# Patient Record
Sex: Male | Born: 1975 | Race: White | Hispanic: No | Marital: Married | Smoking: Current every day smoker
Health system: Southern US, Community
[De-identification: ages and names within clinical notes are randomized; demographics above are authoritative.]

## PROBLEM LIST (undated history)

## (undated) DIAGNOSIS — J45909 Unspecified asthma, uncomplicated: Secondary | ICD-10-CM

## (undated) HISTORY — PX: CHOLECYSTECTOMY: SHX55

---

## 2019-03-14 ENCOUNTER — Other Ambulatory Visit: Payer: Self-pay

## 2019-03-14 ENCOUNTER — Emergency Department (HOSPITAL_COMMUNITY): Payer: Self-pay

## 2019-03-14 ENCOUNTER — Emergency Department (HOSPITAL_COMMUNITY)
Admission: EM | Admit: 2019-03-14 | Discharge: 2019-03-14 | Disposition: A | Discharge status: Home / Self Care | Payer: Self-pay | Attending: Emergency Medicine | Admitting: Emergency Medicine

## 2019-03-14 ENCOUNTER — Encounter (HOSPITAL_COMMUNITY): Payer: Self-pay

## 2019-03-14 DIAGNOSIS — M25562 Pain in left knee: Secondary | ICD-10-CM | POA: Insufficient documentation

## 2019-03-14 DIAGNOSIS — F17210 Nicotine dependence, cigarettes, uncomplicated: Secondary | ICD-10-CM | POA: Insufficient documentation

## 2019-03-14 DIAGNOSIS — J45909 Unspecified asthma, uncomplicated: Secondary | ICD-10-CM | POA: Insufficient documentation

## 2019-03-14 DIAGNOSIS — M25462 Effusion, left knee: Secondary | ICD-10-CM | POA: Insufficient documentation

## 2019-03-14 HISTORY — DX: Unspecified asthma, uncomplicated: J45.909

## 2019-03-14 MED ORDER — IBUPROFEN 400 MG PO TABS
600.0000 mg | ORAL_TABLET | Freq: Once | ORAL | Status: AC
  Administered 2019-03-14: 19:00:00 600 mg via ORAL
  Filled 2019-03-14: qty 2

## 2019-03-14 MED ORDER — IBUPROFEN 600 MG PO TABS: 600.0000 mg | ORAL_TABLET | Freq: Four times a day (QID) | ORAL | 0 refills | Status: DC | PRN

## 2019-03-14 NOTE — ED Notes (Addendum)
Patient requested that we contact his fiance to let her know that she can come back to pt's room to see him in ED. Attempted to contact fiance with no response.

## 2019-03-14 NOTE — Discharge Instructions (Signed)
Please use knee immobilizer and crutches, ice and elevate the knee as much as possible.  Take Motrin 600 mg every 6 hours, you can use Tylenol in addition to this.  If pain and swelling the knee is not improving you will need to follow-up with Dr. Aline Brochure with orthopedics.  If the knee becomes red or hot, swelling worsens, you are unable to bend or straighten the knee at all, you develop fevers or any other new or concerning symptoms return to the emergency department.

## 2019-03-14 NOTE — ED Provider Notes (Signed)
[facility] EMERGENCY DEPARTMENT Provider Note   CSN: [id number] Arrival date & time: 03/14/19  1616    History   Chief Complaint Chief Complaint  Patient presents with  . Knee Pain    HPI Justin Bentley is a 43 y.o. male.     Justin Bentley is a 43 y.o. mal with a history of asthma, who presents to the ED for evaluation of pain and swelling to his left knee.  He reports symptoms started Monday evening after he had been at work doing heavy lifting and lots of bending and twisting motions throughout the day.  Patient reports he has some chronic issues with both knees and his left shoulder but he has not had pain and swelling to this degree before.  He does not follow-up with any orthopedist.  He reports he took some Tylenol with minimal improvement.  He reports it is painful to flex and extend the knee but he continues to be able to walk.  He denies any associated fevers or chills, no nausea or vomiting.  The knee has been swollen but he has not noticed any redness or warmth.  No swelling extending into the calf or foot.  Denies previous surgery to the knee.  No other aggravating or alleviating factors.     Past Medical History:  Diagnosis Date  . Asthma     There are no active problems to display for this patient.       Home Medications    Prior to Admission medications   Medication Sig Start Date End Date Taking? Authorizing Provider  ibuprofen (ADVIL) 600 MG tablet Take 1 tablet (600 mg total) by mouth every 6 (six) hours as needed. 03/14/19   Jacqlyn Larsen, PA-C    Family History No family history on file.  Social History Social History   Tobacco Use  . Smoking status: Current Every Day Smoker    Packs/day: 2.00    Types: Cigarettes  . Smokeless tobacco: Never Used  Substance Use Topics  . Alcohol use: Yes    Comment: occasional  . Drug use: Not Currently     Allergies   Percocet [oxycodone-acetaminophen] and Shellfish allergy   Review of Systems  Review of Systems  Constitutional: Negative for chills and fever.  Gastrointestinal: Negative for nausea and vomiting.  Musculoskeletal: Positive for arthralgias and joint swelling.  Skin: Negative for color change and rash.  Neurological: Negative for weakness and numbness.     Physical Exam Updated Vital Signs BP 123/84 (BP Location: Right Arm)   Pulse 84   Temp 98.6 F (37 C) (Oral)   Resp 14   Ht 5\' 9"  (1.753 m)   Wt 117.9 kg   SpO2 97%   BMI 38.40 kg/m   Physical Exam Vitals signs and nursing note reviewed.  Constitutional:      General: He is not in acute distress.    Appearance: Normal appearance. He is well-developed. He is not ill-appearing or diaphoretic.  HENT:     Head: Normocephalic and atraumatic.  Eyes:     General:        Right eye: No discharge.        Left eye: No discharge.  Pulmonary:     Effort: Pulmonary effort is normal. No respiratory distress.  Musculoskeletal:        General: Swelling present.     Comments: Left knee with swelling and palpable effusion, no obvious bony deformity, able to flex and extend greater than 90 degrees.  There is no overlying erythema or warmth, swelling does not extend into the calf.  2+ DP and TP pulses, 5/5 strength, normal sensation.  Skin:    General: Skin is warm and dry.     Capillary Refill: Capillary refill takes less than 2 seconds.  Neurological:     Mental Status: He is alert and oriented to person, place, and time.     Coordination: Coordination normal.  Psychiatric:        Mood and Affect: Mood normal.        Behavior: Behavior normal.      ED Treatments / Results  Labs (all labs ordered are listed, but only abnormal results are displayed) Labs Reviewed - No data to display  EKG None  Radiology Dg Knee Complete 4 Views Left  Result Date: 03/14/2019 CLINICAL DATA:  Left knee pain and swelling for the past 3 days. No known injury. The pain occurred after heavy lifting the day before. EXAM:  LEFT KNEE - COMPLETE 4+ VIEW COMPARISON:  None. FINDINGS: Moderate to large sized effusion. Mild anterior and lateral subcutaneous edema. Mild-to-moderate patellofemoral degenerative spur formation. Minimal medial and lateral spur formation. No fracture or dislocation seen. IMPRESSION: 1. Moderate to large sized effusion. 2. Mild to moderate patellofemoral and minimal medial and lateral compartment degenerative changes. Electronically Signed   By: Beckie SaltsSteven  Reid M.D.   On: 03/14/2019 18:22    Procedures Procedures (including critical care time)  Medications Ordered in ED Medications  ibuprofen (ADVIL) tablet 600 mg (600 mg Oral Given 03/14/19 1900)     Initial Impression / Assessment and Plan / ED Course  I have reviewed the triage vital signs and the nursing notes.  Pertinent labs & imaging results that were available during my care of the patient were reviewed by me and considered in my medical decision making (see chart for details).  Patient presents with left knee pain and swelling for the past 3 days, this occurred after doing heavy lifting with bending and twisting at work.  Did not fall on the knee.  The knee is neurovascularly intact it is swollen with palpable effusion, but there is no redness or warmth, patient is afebrile and denies any fevers or systemic symptoms at home.  He has had some chronic issues with both knees in the left shoulder.  On x-ray there is a moderate to large effusion with patellofemoral, medial and lateral compartment degenerative changes.  I suspect this is more likely an inflammatory effusion or effusion due to injury while at work rather than a septic joint.  Patient is able to range the joint greater than 90 degrees.  Case discussed with Dr. Estell HarpinZammit who is seen and evaluated patient and agrees with plan, does not think patient needs diagnostic arthrocentesis at this time.  Will place patient in knee immobilizer and provide crutches, discussed RICE therapy, will have  patient follow-up with orthopedics.  Work note provided.  Return precautions discussed.  Patient expresses understanding and agreement with plan.  Discharged home in good condition.  Final Clinical Impressions(s) / ED Diagnoses   Final diagnoses:  Pain and swelling of left knee    ED Discharge Orders         Ordered    ibuprofen (ADVIL) 600 MG tablet  Every 6 hours PRN     03/14/19 1905           Dartha LodgeFord, Jessicaann Overbaugh N, PA-C 03/14/19 Chancy Milroy1938    Zammit, Joseph, MD 03/16/19 [phone]

## 2019-03-14 NOTE — ED Triage Notes (Signed)
Pt states that left knee feels like it has fluid on it. 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

## 2020-12-30 ENCOUNTER — Telehealth: Payer: Self-pay

## 2020-12-30 NOTE — Telephone Encounter (Signed)
Mr Beezley is newly enrolled into the Care Connect program. Client wishes to establish care with [facility].  Called client to set new patient appointment. Appointment secured for 01/13/21 at 1:00 PM Client with his permission texted the appointment date and time along with address and contact number for [facility].  Plan: Follow up with client after his initial appointment.   Francee Nodal RN [employer]

## 2021-01-12 NOTE — Congregational Nurse Program (Signed)
Updated care coordination note to reflect [facility] eligibility until 12/07/21.  Francee Nodal RN [employer]

## 2021-01-13 ENCOUNTER — Ambulatory Visit (HOSPITAL_COMMUNITY)
Admission: RE | Admit: 2021-01-13 | Discharge: 2021-01-13 | Disposition: A | Discharge status: Home / Self Care | Payer: Medicaid Other | Source: Ambulatory Visit | Attending: Physician Assistant | Admitting: Physician Assistant

## 2021-01-13 ENCOUNTER — Other Ambulatory Visit: Payer: Self-pay

## 2021-01-13 ENCOUNTER — Encounter: Payer: Self-pay | Admitting: Physician Assistant

## 2021-01-13 ENCOUNTER — Ambulatory Visit: Payer: Medicaid Other | Admitting: Physician Assistant

## 2021-01-13 VITALS — BP 118/80 | HR 82 | Temp 94.9°F | Ht 71.0 in | Wt 291.0 lb

## 2021-01-13 DIAGNOSIS — K029 Dental caries, unspecified: Secondary | ICD-10-CM

## 2021-01-13 DIAGNOSIS — Z7689 Persons encountering health services in other specified circumstances: Secondary | ICD-10-CM

## 2021-01-13 DIAGNOSIS — M79672 Pain in left foot: Secondary | ICD-10-CM | POA: Insufficient documentation

## 2021-01-13 DIAGNOSIS — Z13220 Encounter for screening for lipoid disorders: Secondary | ICD-10-CM

## 2021-01-13 DIAGNOSIS — J449 Chronic obstructive pulmonary disease, unspecified: Secondary | ICD-10-CM

## 2021-01-13 DIAGNOSIS — Z131 Encounter for screening for diabetes mellitus: Secondary | ICD-10-CM

## 2021-01-13 DIAGNOSIS — F424 Excoriation (skin-picking) disorder: Secondary | ICD-10-CM

## 2021-01-13 DIAGNOSIS — F17200 Nicotine dependence, unspecified, uncomplicated: Secondary | ICD-10-CM

## 2021-01-13 MED ORDER — AMOXICILLIN 500 MG PO CAPS: 500.0000 mg | ORAL_CAPSULE | Freq: Three times a day (TID) | ORAL | 0 refills | Status: AC

## 2021-01-13 MED ORDER — ALBUTEROL SULFATE HFA 108 (90 BASE) MCG/ACT IN AERS: 2.0000 | INHALATION_SPRAY | Freq: Four times a day (QID) | RESPIRATORY_TRACT | 0 refills | Status: DC | PRN

## 2021-01-13 NOTE — Progress Notes (Signed)
BP 118/80   Pulse 82   Temp (!) 94.9 F (34.9 C)   Ht 5\' 11"  (1.803 m)   Wt 291 lb (132 kg)   SpO2 95%   BMI 40.59 kg/m    Subjective:    Patient ID: Justin Bentley, male    DOB: 10-13-1975, 45 y.o.   MRN: [id number]  HPI: Justin Bentley is a 45 y.o. male presenting on 01/13/2021 for No chief complaint on file.   HPI    Pt had a negative covid 19 screening questionnaire.    Pt is 45yoM who presents to establish care.  Pt reports multiple teeth abscess.  Left foot pain for a  Week.  No injury.  Pt says no PCP for a long time.    Relevant past medical, surgical, family and social history reviewed and updated as indicated. Interim medical history since our last visit reviewed. Allergies and medications reviewed and updated.   Current Outpatient Medications:  .  acetaminophen (TYLENOL) 500 MG tablet, Take 500 mg by mouth every 6 (six) hours as needed., Disp: , Rfl:  .  ibuprofen (ADVIL) 200 MG tablet, Take 200 mg by mouth every 6 (six) hours as needed., Disp: , Rfl:     Review of Systems  Per HPI unless specifically indicated above     Objective:    BP 118/80   Pulse 82   Temp (!) 94.9 F (34.9 C)   Ht 5\' 11"  (1.803 m)   Wt 291 lb (132 kg)   SpO2 95%   BMI 40.59 kg/m   Wt Readings from Last 3 Encounters:  01/13/21 291 lb (132 kg)  03/14/19 260 lb (117.9 kg)    Physical Exam Vitals reviewed.  Constitutional:      General: He is not in acute distress.    Appearance: He is well-developed. He is obese. He is not toxic-appearing.  HENT:     Head: Normocephalic and atraumatic.     Right Ear: Tympanic membrane and ear canal normal.     Left Ear: Tympanic membrane and ear canal normal.     Mouth/Throat:     Dentition: Abnormal dentition. Dental tenderness present. No dental abscesses.     Pharynx: No oropharyngeal exudate.     Comments: Extensive tooth decay Eyes:     Extraocular Movements: Extraocular movements intact.     Conjunctiva/sclera:  Conjunctivae normal.     Pupils: Pupils are equal, round, and reactive to light.  Neck:     Thyroid: No thyromegaly.  Cardiovascular:     Rate and Rhythm: Normal rate and regular rhythm.  Pulmonary:     Effort: Pulmonary effort is normal. No tachypnea.     Breath sounds: Wheezing present. No rhonchi or rales.     Comments: Scattered wheezes bilaterally Abdominal:     General: Bowel sounds are normal.     Palpations: Abdomen is soft. There is no mass.     Tenderness: There is no abdominal tenderness.  Musculoskeletal:     Cervical back: Neck supple.     Right lower leg: Edema present.     Left lower leg: Edema present.     Right foot: Normal pulse.     Left foot: Tenderness present. Normal pulse.       Feet:     Comments: Trace BLE edema  Lymphadenopathy:     Cervical: No cervical adenopathy.  Skin:    General: Skin is warm and dry.     Findings: No rash.  Comments: Multiple excoriations BLE where pt had been picking.  No secondary infection.   Neurological:     Mental Status: He is alert and oriented to person, place, and time.     Motor: No weakness or tremor.  Psychiatric:        Attention and Perception: Attention normal.        Speech: Speech normal.        Behavior: Behavior normal.     Comments: inimical       Leg skin picked  No results found for this or any previous visit.    Assessment & Plan:    Encounter Diagnoses  Name Primary?  . Encounter to establish care Yes  . Left foot pain   . Chronic obstructive pulmonary disease, unspecified COPD type (HCC)   . Morbid obesity (HCC)   . Picking own skin   . Tobacco use disorder   . Tooth decay   . Screening cholesterol level   . Screening for diabetes mellitus       -pt was encouraged to get covid booster -will get baseline Labs -pt is put on Dental list.  He is given rx amoxil  -discussed that fracture of the foot is unlikely but will get Xray left foot.  Encouraged ice 10-20 minutes several  times daily, elevate the foot, OTC analgesic -encouraged pt to contact [facility] for counseling, particularly in reference to picking -encouraged Smoking cessation -Albuterol mdi to [facility] -encouraged Weight loss with healthy diet and regular rexercise -encouraged gradually increasing exercise/walking program.  Pt very hostile to the recomendation -pt to follow up 1 month to review labs.  He is to contact office sooner prn

## 2021-01-14 ENCOUNTER — Telehealth: Payer: Self-pay

## 2021-01-14 NOTE — Telephone Encounter (Signed)
Called to follow up with client after [facility]. He did go to get his foot xray and is planning to get his labs drawn. His follow up appointment is 02/12/21 at 1PM. Client was prescribed antibiotic for his teeth and was able to get that filled and has started taking his medication.   No further needs at this time, will mail client welcome letter with my contact information.  Francee Nodal RN [employer]

## 2021-01-20 ENCOUNTER — Other Ambulatory Visit (HOSPITAL_COMMUNITY)
Admission: RE | Admit: 2021-01-20 | Discharge: 2021-01-20 | Disposition: A | Discharge status: Home / Self Care | Payer: Medicaid Other | Source: Ambulatory Visit | Attending: Physician Assistant | Admitting: Physician Assistant

## 2021-01-20 ENCOUNTER — Other Ambulatory Visit: Payer: Self-pay

## 2021-01-20 DIAGNOSIS — Z131 Encounter for screening for diabetes mellitus: Secondary | ICD-10-CM | POA: Insufficient documentation

## 2021-01-20 DIAGNOSIS — Z13220 Encounter for screening for lipoid disorders: Secondary | ICD-10-CM

## 2021-01-20 LAB — LIPID PANEL
Cholesterol: 193 mg/dL (ref 0–200)
HDL: 29 mg/dL — ABNORMAL LOW (ref >40)
LDL Cholesterol: 123 mg/dL — ABNORMAL HIGH (ref 0–99)
Total CHOL/HDL Ratio: 6.7 RATIO
Triglycerides: 205 mg/dL — ABNORMAL HIGH (ref <150)
VLDL: 41 mg/dL — ABNORMAL HIGH (ref 0–40)

## 2021-01-20 LAB — COMPREHENSIVE METABOLIC PANEL
ALT: 37 U/L (ref 0–44)
AST: 32 U/L (ref 15–41)
Albumin: 4.1 g/dL (ref 3.5–5.0)
Alkaline Phosphatase: 52 U/L (ref 38–126)
Anion gap: 7 (ref 5–15)
BUN: 10 mg/dL (ref 6–20)
CO2: 26 mmol/L (ref 22–32)
Calcium: 8.8 mg/dL — ABNORMAL LOW (ref 8.9–10.3)
Chloride: 103 mmol/L (ref 98–111)
Creatinine, Ser: 0.92 mg/dL (ref 0.61–1.24)
GFR, Estimated: >60 mL/min (ref >60)
Glucose, Bld: 116 mg/dL — ABNORMAL HIGH (ref 70–99)
Potassium: 3.6 mmol/L (ref 3.5–5.1)
Sodium: 136 mmol/L (ref 135–145)
Total Bilirubin: 0.6 mg/dL (ref 0.3–1.2)
Total Protein: 7 g/dL (ref 6.5–8.1)

## 2021-01-21 ENCOUNTER — Other Ambulatory Visit: Payer: Self-pay | Admitting: Physician Assistant

## 2021-01-21 DIAGNOSIS — S92345A Nondisplaced fracture of fourth metatarsal bone, left foot, initial encounter for closed fracture: Secondary | ICD-10-CM

## 2021-01-21 LAB — HEMOGLOBIN A1C
Hgb A1c MFr Bld: 5.7 % — ABNORMAL HIGH (ref 4.8–5.6)
Mean Plasma Glucose: 117 mg/dL

## 2021-02-03 ENCOUNTER — Ambulatory Visit: Payer: Medicaid Other

## 2021-02-03 ENCOUNTER — Encounter: Payer: Self-pay | Admitting: Orthopedic Surgery

## 2021-02-03 ENCOUNTER — Other Ambulatory Visit: Payer: Self-pay

## 2021-02-03 ENCOUNTER — Ambulatory Visit (INDEPENDENT_AMBULATORY_CARE_PROVIDER_SITE_OTHER): Payer: Self-pay | Admitting: Orthopedic Surgery

## 2021-02-03 VITALS — BP 121/83 | HR 100 | Ht 71.0 in | Wt 288.0 lb

## 2021-02-03 DIAGNOSIS — M79672 Pain in left foot: Secondary | ICD-10-CM

## 2021-02-03 DIAGNOSIS — M84376A Stress fracture, unspecified foot, initial encounter for fracture: Secondary | ICD-10-CM

## 2021-02-03 DIAGNOSIS — M84375A Stress fracture, left foot, initial encounter for fracture: Secondary | ICD-10-CM

## 2021-02-03 NOTE — Progress Notes (Signed)
New Patient Visit  Assessment: Justin Bentley is a 45 y.o. male with the following: 1. Stress fracture of foot, initial encounter; Left 4th metatarsal  Plan: Reviewed radiographs in clinic today demonstrates a fracture of the fourth metatarsal shaft, most consistent with a stress fracture.  There is some callus formation around the fracture.  Injury was sustained atraumatically.  He states he was working in the yard, slight onset of pain following this.  No recent change in his level of activity.  No obvious metabolic abnormality at this time.  I discussed all this with the patient in clinic, and advised him to focus on weightbearing through his heel, without weightbearing through the forefoot.  We must allow time for this fracture to heal.  We discussed the possibility of fitting him for a walking boot in clinic today, but he stated he has a postop shoe, and he can continue to wear this.  If he remains nonweightbearing on his forefoot, I do think that this will continue to heal.  He stated his understanding.  Follow-up in 4 weeks.   Follow-up: Return in about 4 weeks (around 03/03/2021).  Subjective:  Chief Complaint  Patient presents with   Foot Pain    Lt foot pain for 3 wks. NKI    History of Present Illness: Justin Bentley is a 45 y.o. male who has been referred to clinic today by Jacquelin Hawking, PA-C for evaluation of left foot pain.  He has had pain in the left foot for at least 3 weeks.  He denies a specific injury.  He states that he did some work in his yard, shortly thereafter he started to notice pain in his foot.  He has rolled his ankle, while doing this work, but notes that it was not a significant twisting injury.  He has been able to walk with a regular shoe.  He does have a postop shoe, but states this is slippery and prefers not to wear it.  Minimal medications needed at this time.  He does have a history of additional injuries to the left foot and ankle   Review of  Systems: No fevers or chills No numbness or tingling No chest pain No shortness of breath No bowel or bladder dysfunction No GI distress No headaches   Medical History:  Past Medical History:  Diagnosis Date   Asthma     Past Surgical History:  Procedure Laterality Date   CHOLECYSTECTOMY      Family History  Problem Relation Age of Onset   Cancer Mother        colon   Heart disease Mother    Cancer Father        throat   Social History   Tobacco Use   Smoking status: Every Day    Packs/day: 2.00    Pack years: 0.00    Types: Cigarettes   Smokeless tobacco: Never  Vaping Use   Vaping Use: Former  Substance Use Topics   Alcohol use: Yes    Comment: occasional   Drug use: Not Currently    Types: Marijuana    Comment: none since 2009    Allergies  Allergen Reactions   Percocet [Oxycodone-Acetaminophen] Nausea Only    "feel high"   Shellfish Allergy Swelling    Current Meds  Medication Sig   amoxicillin (AMOXIL) 500 MG capsule     Objective: BP 121/83   Pulse 100   Ht 5\' 11"  (1.803 m)   Wt 288 lb (130.6 kg)  BMI 40.17 kg/m   Physical Exam:  General: Alert and oriented.  No acute distress. Gait: Left sided antalgic gait.  Evaluation of left foot demonstrates no swelling.  No ecchymosis is appreciated.  He does have tenderness to palpation along the shaft of the fourth metatarsal.  Active dorsiflexion of the TA/EHL.  Sensation is intact throughout the left foot.  Toes are warm and well-perfused.  2+ DP pulse.    IMAGING: I personally ordered and reviewed the following images  X-rays obtained in clinic today demonstrates a nondisplaced fracture of the fourth metatarsal shaft.  There is callus formation around the fracture site.  No additional injuries are noted.  Impression: Healing left fourth metatarsal shaft fracture   New Medications:  No orders of the defined types were placed in this encounter.     Oliver Barre,  MD  02/03/2021 1:06 PM

## 2021-02-12 ENCOUNTER — Encounter: Payer: Self-pay | Admitting: Physician Assistant

## 2021-02-12 ENCOUNTER — Ambulatory Visit: Payer: Medicaid Other | Admitting: Physician Assistant

## 2021-02-12 DIAGNOSIS — K029 Dental caries, unspecified: Secondary | ICD-10-CM

## 2021-02-12 DIAGNOSIS — R7303 Prediabetes: Secondary | ICD-10-CM

## 2021-02-12 DIAGNOSIS — E785 Hyperlipidemia, unspecified: Secondary | ICD-10-CM

## 2021-02-12 DIAGNOSIS — F17200 Nicotine dependence, unspecified, uncomplicated: Secondary | ICD-10-CM | POA: Insufficient documentation

## 2021-02-12 DIAGNOSIS — E669 Obesity, unspecified: Secondary | ICD-10-CM

## 2021-02-12 DIAGNOSIS — J449 Chronic obstructive pulmonary disease, unspecified: Secondary | ICD-10-CM | POA: Insufficient documentation

## 2021-02-12 NOTE — Progress Notes (Signed)
There were no vitals taken for this visit.   Subjective:    Patient ID: Justin Bentley, male    DOB: [birth year], 45 y.o.   MRN: [id number]  HPI: Justin Bentley is a 45 y.o. male presenting on 02/12/2021 for No chief complaint on file.   HPI   This is a telemedicine appointment through [employer] due to coronavirus pandemic.   I connected with  [name] on 02/12/21 by a video enabled telemedicine application and verified that I am speaking with the correct person using two identifiers.   I discussed the limitations of evaluation and management by telemedicine. The patient expressed understanding and agreed to proceed.  Pt is in his parked car.  Provider is at office.    Pt is 45yoM who has appointment today to review labs that were drawn after his new patient appointment last month.    Pt was referred to orthopedics after xray of the foot revealed a stress fracture.  Pt has been seen and treated by the orthopedist.  Pt is distressed he couldn't see his foot xray pictures on mychart.  Explained to pt that only the results for xrays are availiable.    Pt Requests breztri  because he saw it on TV.   He says he Uses his albuterol mdi about 4/day.  He continues to smoke.  Pt also requesting pain medications.     Relevant past medical, surgical, family and social history reviewed and updated as indicated. Interim medical history since our last visit reviewed. Allergies and medications reviewed and updated.    Current Outpatient Medications:    acetaminophen (TYLENOL) 500 MG tablet, Take 500 mg by mouth every 6 (six) hours as needed., Disp: , Rfl:    albuterol (VENTOLIN HFA) 108 (90 Base) MCG/ACT inhaler, Inhale 2 puffs into the lungs every 6 (six) hours as needed for wheezing or shortness of breath., Disp: 3 each, Rfl: 0   amoxicillin (AMOXIL) 500 MG capsule, , Disp: , Rfl:    ibuprofen (ADVIL) 200 MG tablet, Take 200 mg by mouth every 6 (six) hours as needed., Disp: , Rfl:      Review of Systems  Per HPI unless specifically indicated above     Objective:    There were no vitals taken for this visit.  Wt Readings from Last 3 Encounters:  02/03/21 288 lb (130.6 kg)  01/13/21 291 lb (132 kg)  03/14/19 260 lb (117.9 kg)    Physical Exam Constitutional:      General: He is not in acute distress.    Appearance: He is obese.  HENT:     Head: Normocephalic.     Mouth/Throat:     Dentition: Abnormal dentition. Dental caries present.     Comments: Extensive tooth decay visible with routine talking Pulmonary:     Effort: No respiratory distress.     Comments: Pt talks in complete sentences without dyspnea Neurological:     Mental Status: He is alert and oriented to person, place, and time.  Psychiatric:        Attention and Perception: Attention normal.     Comments: Hostile affect    Results for orders placed or performed during the hospital encounter of 01/20/21  Hemoglobin A1c  Result Value Ref Range   Hgb A1c MFr Bld 5.7 (H) 4.8 - 5.6 %   Mean Plasma Glucose 117 mg/dL  Lipid panel  Result Value Ref Range   Cholesterol 193 0 - 200 mg/dL   Triglycerides 568 (H) <  150 mg/dL   HDL 29 (L) >49 mg/dL   Total CHOL/HDL Ratio 6.7 RATIO   VLDL 41 (H) 0 - 40 mg/dL   LDL Cholesterol 675 (H) 0 - 99 mg/dL  Comprehensive metabolic panel  Result Value Ref Range   Sodium 136 135 - 145 mmol/L   Potassium 3.6 3.5 - 5.1 mmol/L   Chloride 103 98 - 111 mmol/L   CO2 26 22 - 32 mmol/L   Glucose, Bld 116 (H) 70 - 99 mg/dL   BUN 10 6 - 20 mg/dL   Creatinine, Ser 9.16 0.61 - 1.24 mg/dL   Calcium 8.8 (L) 8.9 - 10.3 mg/dL   Total Protein 7.0 6.5 - 8.1 g/dL   Albumin 4.1 3.5 - 5.0 g/dL   AST 32 15 - 41 U/L   ALT 37 0 - 44 U/L   Alkaline Phosphatase 52 38 - 126 U/L   Total Bilirubin 0.6 0.3 - 1.2 mg/dL   GFR, Estimated >38 >46 mL/min   Anion gap 7 5 - 15      Assessment & Plan:    Encounter Diagnoses  Name Primary?   Chronic obstructive pulmonary  disease, unspecified COPD type (HCC) Yes   Tobacco use disorder    Tooth decay    Obesity, unspecified classification, unspecified obesity type, unspecified whether serious comorbidity present    Prediabetes    Hyperlipidemia, unspecified hyperlipidemia type        -Reviewed labs with pt  -Pt counseled on lowfat diet and after his foot fracture is better, encouraged him to begin regular exercise.  He was given reading information on high cholesterol and lowfat diet -pt was Counseled on prediabetes and strategies to reduce risk of progressing to diabetes -Pt given application for [facility] patient assistance to order advair.  He is to return application to office when completed so it can be submitted with Rx to [facility].  Urged smoking cesstaion -pt counseled that narcotic pain meds are not prescribed by this office -pt is on dental list -pt to follow up 6 months to recheck copd

## 2021-02-16 ENCOUNTER — Other Ambulatory Visit: Payer: Self-pay | Admitting: Physician Assistant

## 2021-02-16 MED ORDER — ADVAIR HFA 45-21 MCG/ACT IN AERO: 2.0000 | INHALATION_SPRAY | Freq: Two times a day (BID) | RESPIRATORY_TRACT | 3 refills | Status: DC

## 2021-03-03 ENCOUNTER — Ambulatory Visit (INDEPENDENT_AMBULATORY_CARE_PROVIDER_SITE_OTHER): Payer: Self-pay | Admitting: Orthopedic Surgery

## 2021-03-03 ENCOUNTER — Other Ambulatory Visit: Payer: Self-pay

## 2021-03-03 ENCOUNTER — Encounter: Payer: Self-pay | Admitting: Orthopaedic Surgery

## 2021-03-03 ENCOUNTER — Ambulatory Visit: Payer: Medicaid Other

## 2021-03-03 ENCOUNTER — Encounter: Payer: Self-pay | Admitting: Orthopedic Surgery

## 2021-03-03 VITALS — Ht 71.0 in | Wt 288.0 lb

## 2021-03-03 DIAGNOSIS — M84375D Stress fracture, left foot, subsequent encounter for fracture with routine healing: Secondary | ICD-10-CM

## 2021-03-03 NOTE — Progress Notes (Signed)
Orthopaedic Clinic Return  Assessment: Justin Bentley is a 45 y.o. male with the following: Left fourth metatarsal stress fracture, healing  Plan: Repeat radiographs today demonstrate callus formation at the stress fracture site.  He is now tolerating regular footwear.  His pain is minimal.  Advised him to limit his activities at this time, but I do think he will continue to heal the fracture.  No further follow-up is needed regarding his left foot.  He did state he has been having some back pain, and is previously diagnosed with ankylosing spondylitis.  He states he will plan to make a follow-up appointment for a work-up for his back.  Follow-up: Return if symptoms worsen or fail to improve.   Subjective:  Chief Complaint  Patient presents with   Left foot pain    4th MT stress fracture    History of Present Illness: Justin Bentley is a 45 y.o. male who returns to clinic for repeat evaluation of his left foot.  He was seen in clinic approximately 1 month ago, and diagnosed with stress fracture of his fourth metatarsal.  Since then, he has done well.  He is transition to a regular shoe.  He does continue to have some pain in this left foot, but it is tolerable.  He is not using an assistive device for ambulation.  No recent injuries.  He does continue to have some swelling of the left foot, but no bruising.  Of note, he is interested in a work-up for his back.  He was previously told that he had ankylosing spondylitis, but has received no further treatment.  He is seeking a second opinion.  Review of Systems: No fevers or chills No numbness or tingling No chest pain No shortness of breath No bowel or bladder dysfunction No GI distress No headaches   Objective: Ht 5\' 11"  (1.803 m)   Wt 288 lb (130.6 kg)   BMI 40.17 kg/m   Physical Exam:  Alert and oriented.  No acute distress.  He walks with a mildly antalgic left-sided gait.  Evaluation left foot demonstrates minimal  swelling.  No bruising is appreciated.  He does have some tenderness to palpation along the shaft of the fourth metatarsal.  He does tolerate pressure on the dorsum and plantar aspects of his foot.  Toes are warm and well-perfused  IMAGING: I personally ordered and reviewed the following images:  X-ray of the left foot was obtained in clinic today, compared to previous x-rays.  There has been interval callus formation at the stress fracture of the fourth metatarsal.  There has been no interval displacement.  Fracture line is more visible.      Impression: Healing stress fracture of the left fourth metatarsal shaft  , MD 03/03/2021 10:10 PM

## 2021-04-24 ENCOUNTER — Other Ambulatory Visit: Payer: Self-pay | Admitting: Physician Assistant

## 2021-07-22 ENCOUNTER — Ambulatory Visit: Payer: Medicaid Other | Admitting: Physician Assistant

## 2021-07-22 ENCOUNTER — Encounter: Payer: Self-pay | Admitting: Physician Assistant

## 2021-07-22 ENCOUNTER — Telehealth: Payer: Self-pay

## 2021-07-22 DIAGNOSIS — J449 Chronic obstructive pulmonary disease, unspecified: Secondary | ICD-10-CM

## 2021-07-22 DIAGNOSIS — F17200 Nicotine dependence, unspecified, uncomplicated: Secondary | ICD-10-CM

## 2021-07-22 DIAGNOSIS — Z20822 Contact with and (suspected) exposure to covid-19: Secondary | ICD-10-CM

## 2021-07-22 DIAGNOSIS — J069 Acute upper respiratory infection, unspecified: Secondary | ICD-10-CM

## 2021-07-22 NOTE — Progress Notes (Signed)
There were no vitals taken for this visit.   Subjective:    Patient ID: Justin Bentley, male    DOB: August 22, 1975, 45 y.o.   MRN: [id number]  HPI: Justin Bentley is a 45 y.o. male presenting on 07/22/2021 for No chief complaint on file.   HPI  This is a telemedicine appointment.  It is via Telephone as pt was unable to get connected with video due to slow wifi  I connected with  Justin Bentley on 07/22/21 by a video enabled telemedicine application and verified that I am speaking with the correct person using two identifiers.   I discussed the limitations of evaluation and management by telemedicine. The patient expressed understanding and agreed to proceed.  Pt is at home.  Provider is at office.      Pt appointment today is because hs is Sick since yesterday.  He says he is Achey all over, has Chills, Cough and post tussive emesis, and Phlegm.  He is Using some OTC mucinex.   He is still using his advair and is using his albuterol inhaler.   He has No fevers but chills.   He reports Some wheezing and sob.  He has Not done a covid test.  He is a smoker.   His wife is also sick.   He got covid booster during the summer months   01-13-21     Relevant past medical, surgical, family and social history reviewed and updated as indicated. Interim medical history since our last visit reviewed. Allergies and medications reviewed and updated.     Current Outpatient Medications:    acetaminophen (TYLENOL) 500 MG tablet, Take 500 mg by mouth every 6 (six) hours as needed., Disp: , Rfl:    albuterol (VENTOLIN HFA) 108 (90 Base) MCG/ACT inhaler, INHALE 2 PUFFS BY MOUTH EVERY 6 HOURS AS NEEDED FOR COUGHING, WHEEZING, OR SHORTNESS OF BREATH, Disp: 20.1 g, Rfl: 0   fluticasone-salmeterol (ADVAIR HFA) 45-21 MCG/ACT inhaler, Inhale 2 puffs into the lungs 2 (two) times daily., Disp: 3 each, Rfl: 3     Review of Systems  Per HPI unless specifically indicated above     Objective:    There  were no vitals taken for this visit.  Wt Readings from Last 3 Encounters:  03/03/21 288 lb (130.6 kg)  03/03/21 288 lb (130.6 kg)  02/03/21 288 lb (130.6 kg)    Physical Exam Pulmonary:     Effort: No respiratory distress.     Comments: Pt is talking in complete sentence and is very chatty without dyspnea.  No wheezing audible.  Neurological:     Mental Status: He is alert and oriented to person, place, and time.  Psychiatric:        Attention and Perception: Attention normal.        Speech: Speech normal.        Behavior: Behavior is cooperative.          Assessment & Plan:   Encounter Diagnoses  Name Primary?   Upper respiratory tract infection, unspecified type Yes   Suspected COVID-19 virus infection    Tobacco use disorder    Chronic obstructive pulmonary disease, unspecified COPD type (HCC)       -pt to continue with current management including his regular advair, albuterol prn, mucinex prn, apap or ibu prn.   -He is encouraged to avoid smoking -He is encouraged to rest and get plenty of fluids -he Can pick up covid test from our office.  Discussed  he might have covid or he might have regular cold/URI.  Discussed it did not sound like copd exacerbation so no steroids given, no antibiotics.  If his test is positive, he should contact office to discuss rx for paxlovid -discussed reasons he would need to go to ER (problems breathing, CP, cyanosis etc)

## 2021-07-22 NOTE — Telephone Encounter (Signed)
Call from pt requesting antibiotics, informed he is suspected to have a virus so that will not be effective, pt states his wife is coming to pick up covid test.

## 2021-07-24 ENCOUNTER — Other Ambulatory Visit: Payer: Self-pay | Admitting: Physician Assistant

## 2021-08-24 ENCOUNTER — Other Ambulatory Visit: Payer: Self-pay

## 2021-08-24 ENCOUNTER — Ambulatory Visit: Payer: Medicaid Other | Admitting: Physician Assistant

## 2021-08-24 ENCOUNTER — Encounter: Payer: Self-pay | Admitting: Physician Assistant

## 2021-08-24 VITALS — BP 120/80 | HR 90 | Temp 97.1°F | Wt 296.0 lb

## 2021-08-24 DIAGNOSIS — J449 Chronic obstructive pulmonary disease, unspecified: Secondary | ICD-10-CM

## 2021-08-24 DIAGNOSIS — F17200 Nicotine dependence, unspecified, uncomplicated: Secondary | ICD-10-CM

## 2021-08-24 DIAGNOSIS — K625 Hemorrhage of anus and rectum: Secondary | ICD-10-CM

## 2021-08-24 NOTE — Progress Notes (Signed)
BP 120/80    Pulse 90    Temp (!) 97.1 F (36.2 C)    Wt 296 lb (134.3 kg)    SpO2 98%    BMI 41.28 kg/m    Subjective:    Patient ID: Justin Bentley, male    DOB: [birth year], 46 y.o.   MRN: [id number]  HPI: Justin Bentley is a 46 y.o. male presenting on 08/24/2021 for COPD   HPI  Chief Complaint  Patient presents with   COPD    Pt says he had viral infecton last week so was using albuterol more frequently.  He is still smoking.  He is not working  He reports blood in stool lately for past several weeks. He thinks he may be having the bleeding PR due to "squeezing his butt cheeks together to help his erection."    He denies constipation and straining to move his bowels.   He has been using OTC supplements for ED:  erectin, vardaxyn, bluoxyn, vardaxyn, labelX, semenax.  He says he has tried viagra and cialis in the past and they neither one work.  He says he has a really small penis and has difficult achieving erection to get penetration. .       Relevant past medical, surgical, family and social history reviewed and updated as indicated. Interim medical history since our last visit reviewed. Allergies and medications reviewed and updated.   Current Outpatient Medications:    acetaminophen (TYLENOL) 500 MG tablet, Take 500 mg by mouth every 6 (six) hours as needed., Disp: , Rfl:    albuterol (VENTOLIN HFA) 108 (90 Base) MCG/ACT inhaler, INHALE 2 PUFFS BY MOUTH EVERY 6 HOURS AS NEEDED FOR COUGHING, WHEEZING, OR SHORTNESS OF BREATH, Disp: 3 each, Rfl: 0   fluticasone-salmeterol (ADVAIR HFA) 45-21 MCG/ACT inhaler, Inhale 2 puffs into the lungs 2 (two) times daily., Disp: 3 each, Rfl: 3     Review of Systems  Per HPI unless specifically indicated above     Objective:    BP 120/80    Pulse 90    Temp (!) 97.1 F (36.2 C)    Wt 296 lb (134.3 kg)    SpO2 98%    BMI 41.28 kg/m   Wt Readings from Last 3 Encounters:  08/24/21 296 lb (134.3 kg)  03/03/21 288 lb (130.6 kg)   03/03/21 288 lb (130.6 kg)    Physical Exam Vitals reviewed. Exam conducted with a chaperone present.  Constitutional:      General: He is not in acute distress.    Appearance: He is well-developed. He is obese. He is not ill-appearing.     Comments: Very strong cigarette smoke odor  HENT:     Head: Normocephalic and atraumatic.  Cardiovascular:     Rate and Rhythm: Normal rate and regular rhythm.  Pulmonary:     Effort: Pulmonary effort is normal.     Breath sounds: Normal breath sounds. No wheezing.  Abdominal:     General: Bowel sounds are normal.     Palpations: Abdomen is soft.     Tenderness: There is no abdominal tenderness.  Genitourinary:    Rectum: No mass, tenderness, anal fissure or external hemorrhoid. Normal anal tone.     Comments: (CV chaperone) Musculoskeletal:     Cervical back: Neck supple.     Right lower leg: No edema.     Left lower leg: No edema.  Lymphadenopathy:     Cervical: No cervical adenopathy.  Skin:  General: Skin is warm and dry.  Neurological:     Mental Status: He is alert and oriented to person, place, and time.  Psychiatric:        Attention and Perception: Attention normal.        Speech: Speech normal.        Behavior: Behavior normal. Behavior is cooperative.          Assessment & Plan:   Encounter Diagnoses  Name Primary?   Chronic obstructive pulmonary disease, unspecified COPD type (HCC) Yes   Tobacco use disorder    Morbid obesity (HCC)    Rectal bleeding      -pt is given cafa/application for [employer] financial assistance -will referTo GI for evaluation of blood PR.  Pt encouraged to get plenty of fiber in his diet/use OTC like metamucil, drink plenty of water and avoid straining when moving bowels -pt to Continue inhaler prn -pt is strongly encouraged to Stop smoking to help his breathing -pt to follow up  6 months.  He is to contact office sooner prn

## 2021-08-31 ENCOUNTER — Encounter (INDEPENDENT_AMBULATORY_CARE_PROVIDER_SITE_OTHER): Payer: Self-pay | Admitting: *Deleted

## 2021-10-27 ENCOUNTER — Ambulatory Visit (INDEPENDENT_AMBULATORY_CARE_PROVIDER_SITE_OTHER): Payer: Medicaid Other | Admitting: Gastroenterology

## 2021-11-20 ENCOUNTER — Other Ambulatory Visit: Payer: Self-pay | Admitting: Physician Assistant

## 2021-12-29 ENCOUNTER — Encounter (INDEPENDENT_AMBULATORY_CARE_PROVIDER_SITE_OTHER): Payer: Self-pay

## 2021-12-29 ENCOUNTER — Ambulatory Visit (INDEPENDENT_AMBULATORY_CARE_PROVIDER_SITE_OTHER): Payer: Medicaid Other | Admitting: Gastroenterology

## 2022-02-17 ENCOUNTER — Other Ambulatory Visit: Payer: Self-pay | Admitting: Physician Assistant

## 2022-02-17 ENCOUNTER — Telehealth: Payer: Self-pay

## 2022-02-17 MED ORDER — ALBUTEROL SULFATE HFA 108 (90 BASE) MCG/ACT IN AERS: INHALATION_SPRAY | RESPIRATORY_TRACT | 0 refills | Status: AC

## 2022-02-17 MED ORDER — FLUTICASONE-SALMETEROL 45-21 MCG/ACT IN AERO: 2.0000 | INHALATION_SPRAY | Freq: Two times a day (BID) | RESPIRATORY_TRACT | 0 refills | Status: AC

## 2022-02-17 NOTE — Telephone Encounter (Signed)
Client called wanting to renew [employer], in reviewing information needed client states he signed up for insurance over the market place by phone with [facility]. Discussed that we serve only uninsured. Will email client list of current providers that will take insurance per his request. Discussed that client check all the details of his insurance such as copays and out of pocket costs. Discussed that if his situation changes to call [employer] back. Discussed that [facility] does not see patients with insurance either. He states understanding.  Francee Nodal RN [employer]

## 2022-02-22 ENCOUNTER — Ambulatory Visit: Payer: Medicaid Other | Admitting: Physician Assistant

## 2022-03-30 IMAGING — DX DG FOOT COMPLETE 3+V*L*
3 series · 3 of 3 positions shown · non-contrast
Comparison: None.

CLINICAL DATA: Left foot fourth ray pain for 1 week after injury

EXAM:
LEFT FOOT - COMPLETE 3+ VIEW

[foot ap]
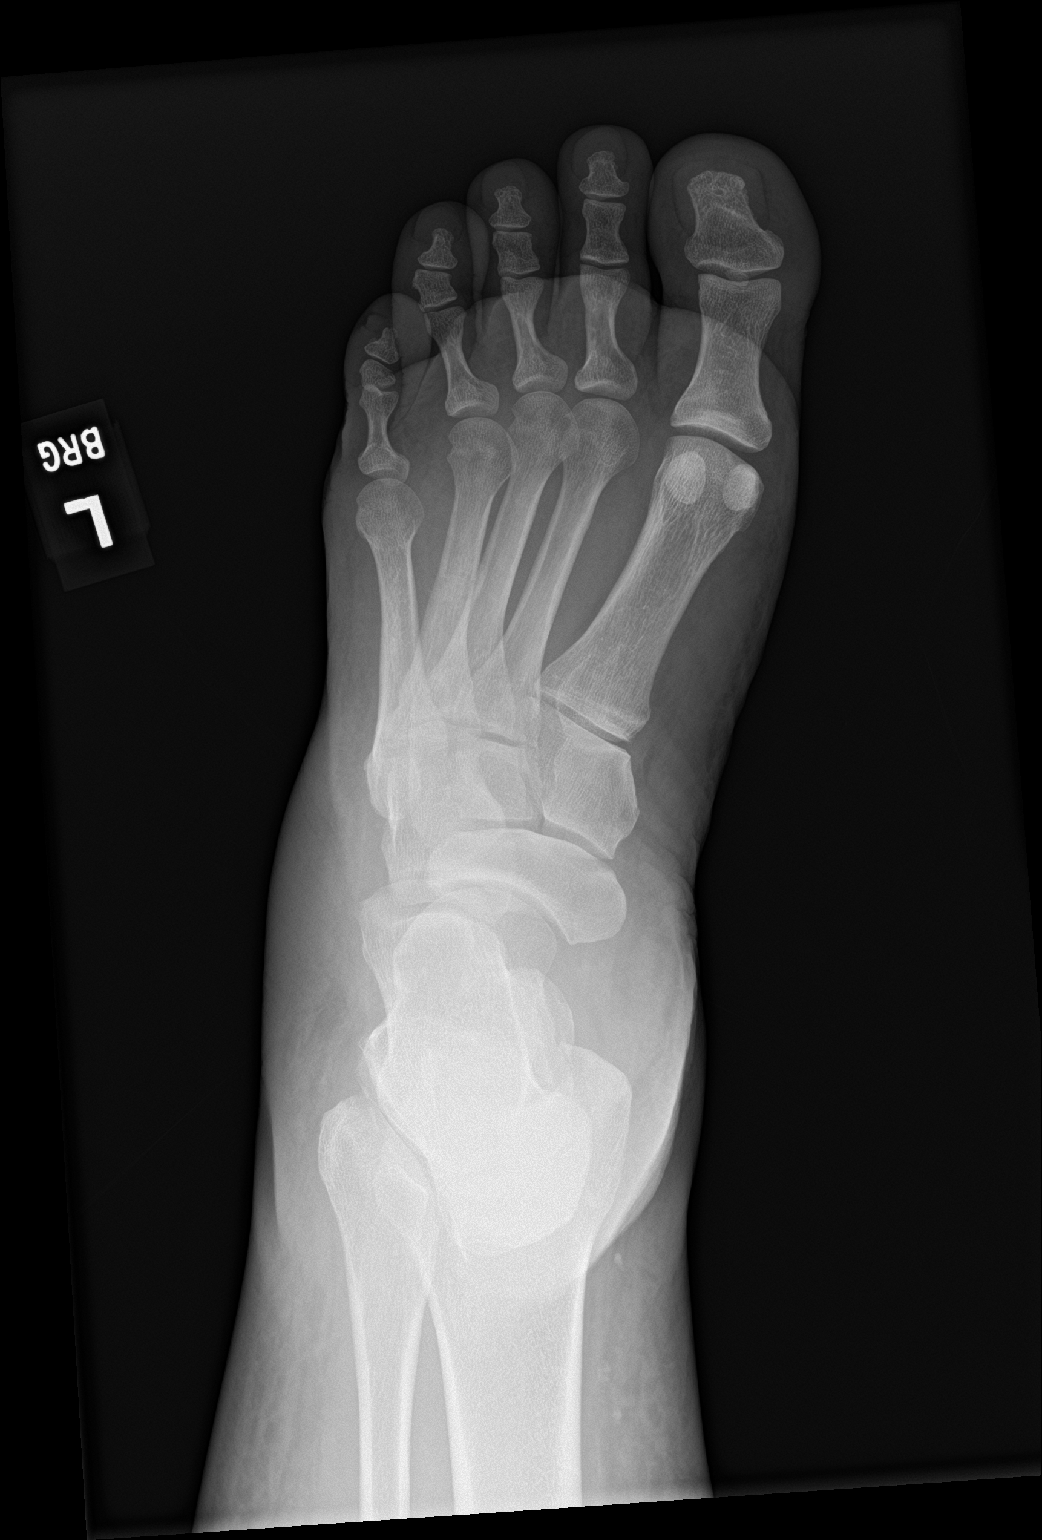

[foot obl]
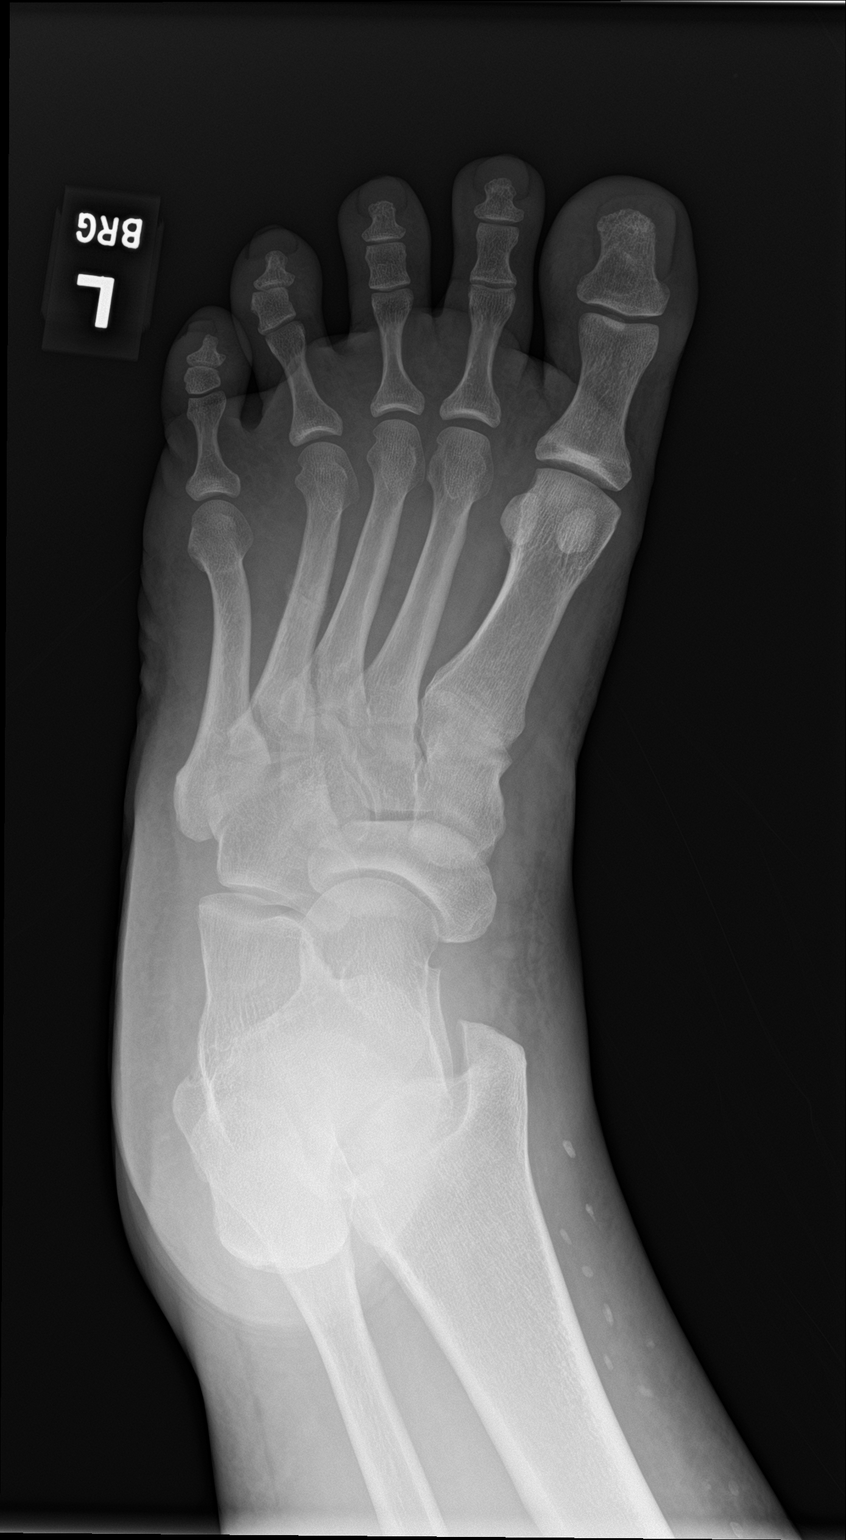

[foot lat]
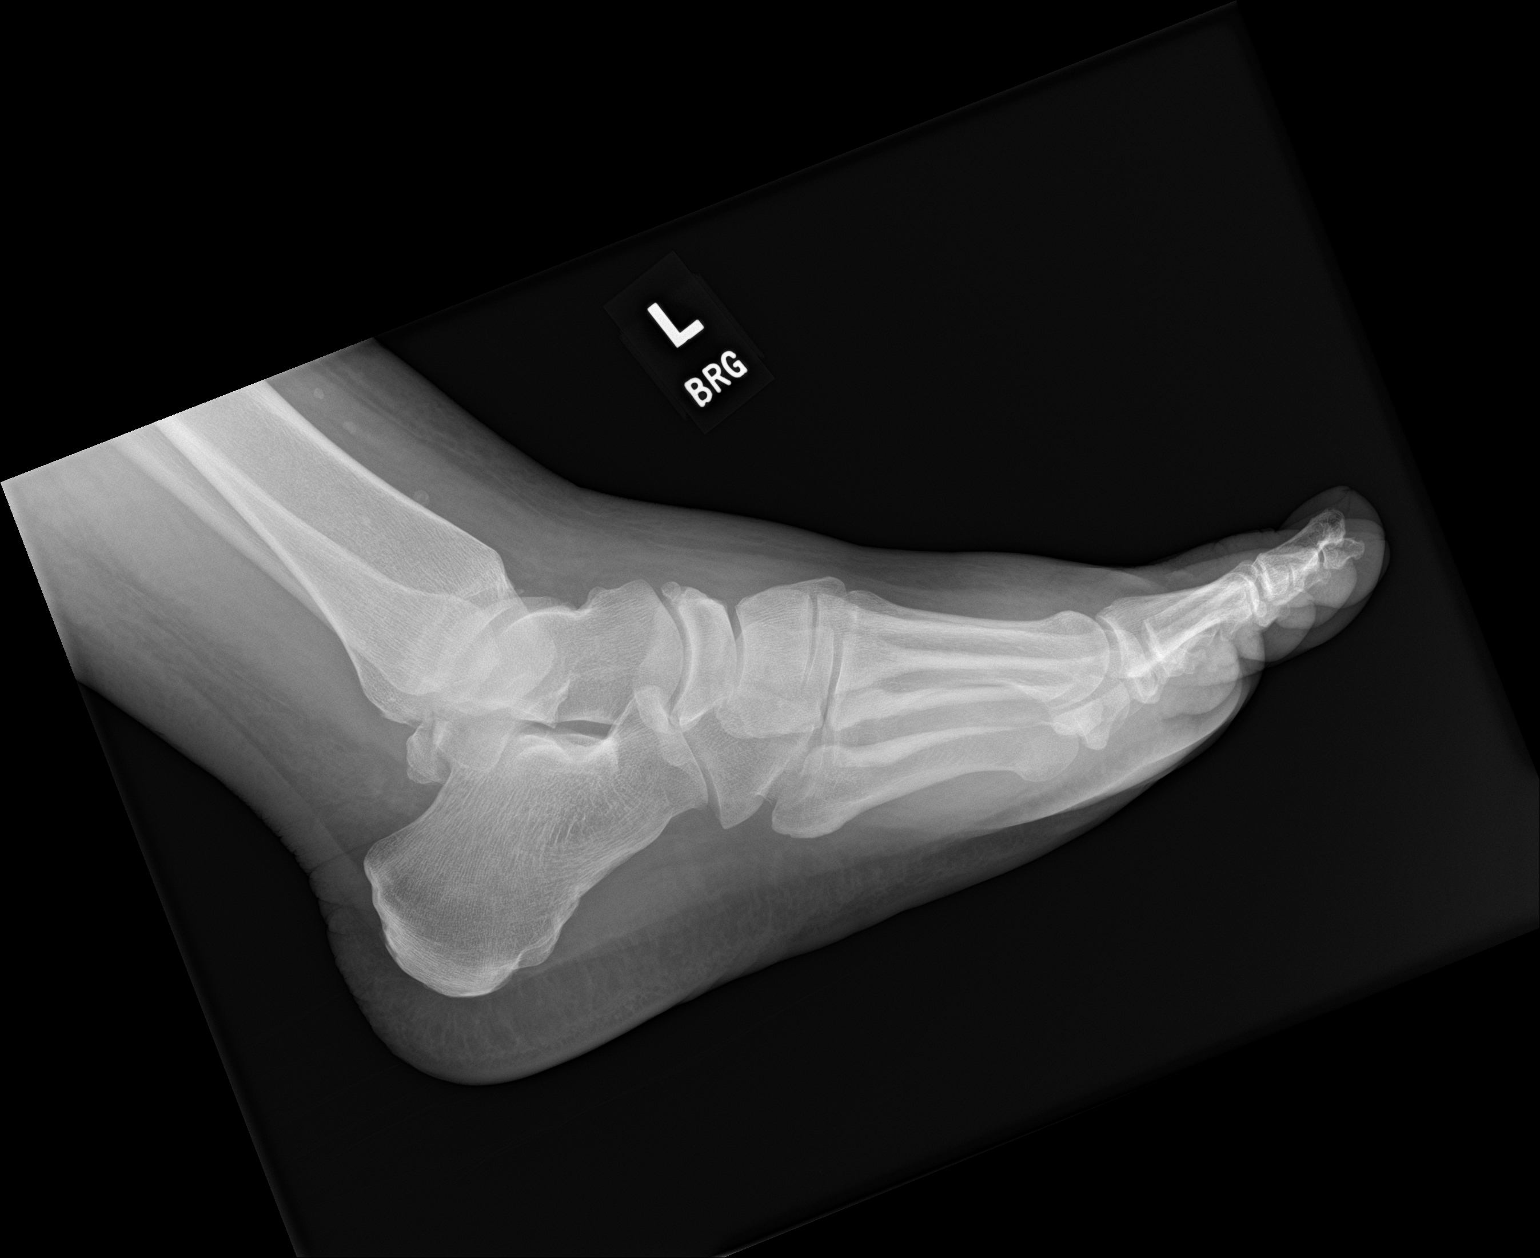

[3 of 3 positions shown; findings below may reference images not displayed]

FINDINGS: Subacute healing nondisplaced non articular midshaft fracture in the
left fourth metatarsal with faint periosteal reaction and endosteal
sclerosis. No additional fracture. No dislocation. Lesser sac joint
appears intact. No focal osseous lesions. Mild soft tissue swelling
throughout the dorsal left foot. No radiopaque foreign bodies. No
significant arthropathy.
IMPRESSION: 1. Subacute healing nondisplaced nonarticular midshaft fracture in
the left fourth metatarsal.
2. Mild dorsal left foot soft tissue swelling.

## 2022-06-09 NOTE — Progress Notes (Unsigned)
This encounter was created in error - please disregard.

## 2022-07-22 DIAGNOSIS — R194 Change in bowel habit: Secondary | ICD-10-CM | POA: Diagnosis not present

## 2022-07-22 DIAGNOSIS — Z13220 Encounter for screening for lipoid disorders: Secondary | ICD-10-CM | POA: Diagnosis not present

## 2022-07-22 DIAGNOSIS — R062 Wheezing: Secondary | ICD-10-CM | POA: Diagnosis not present

## 2022-07-22 DIAGNOSIS — Z800 Family history of malignant neoplasm of digestive organs: Secondary | ICD-10-CM | POA: Diagnosis not present

## 2022-07-22 DIAGNOSIS — Z125 Encounter for screening for malignant neoplasm of prostate: Secondary | ICD-10-CM | POA: Diagnosis not present

## 2022-07-22 DIAGNOSIS — J449 Chronic obstructive pulmonary disease, unspecified: Secondary | ICD-10-CM | POA: Diagnosis not present

## 2022-08-19 DIAGNOSIS — R748 Abnormal levels of other serum enzymes: Secondary | ICD-10-CM | POA: Diagnosis not present

## 2022-10-07 ENCOUNTER — Encounter: Payer: Self-pay | Admitting: Radiology
